# Patient Record
Sex: Male | Born: 2000 | Race: Black or African American | Hispanic: No | Marital: Single | Smoking: Never smoker
Health system: Southern US, Community
[De-identification: ages and names within clinical notes are randomized; demographics above are authoritative.]

## PROBLEM LIST (undated history)

## (undated) DIAGNOSIS — M25312 Other instability, left shoulder: Secondary | ICD-10-CM

## (undated) DIAGNOSIS — Z9229 Personal history of other drug therapy: Secondary | ICD-10-CM

## (undated) HISTORY — PX: OTHER SURGICAL HISTORY: SHX169

---
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
## 2019-03-25 DIAGNOSIS — M2630 Unspecified anomaly of tooth position of fully erupted tooth or teeth: Secondary | ICD-10-CM | POA: Diagnosis not present

## 2019-04-15 DIAGNOSIS — Z20828 Contact with and (suspected) exposure to other viral communicable diseases: Secondary | ICD-10-CM | POA: Diagnosis not present

## 2020-05-18 ENCOUNTER — Telehealth: Payer: Self-pay | Admitting: Family Medicine

## 2020-05-18 ENCOUNTER — Encounter: Payer: Self-pay | Admitting: Family Medicine

## 2020-05-18 NOTE — Telephone Encounter (Signed)
Unable to update his phone number. Mom will have him call us back.

## 2021-07-11 DIAGNOSIS — J351 Hypertrophy of tonsils: Secondary | ICD-10-CM | POA: Diagnosis not present

## 2021-07-11 DIAGNOSIS — L659 Nonscarring hair loss, unspecified: Secondary | ICD-10-CM | POA: Diagnosis not present

## 2022-05-07 DIAGNOSIS — R0789 Other chest pain: Secondary | ICD-10-CM | POA: Diagnosis not present

## 2023-01-05 ENCOUNTER — Emergency Department (HOSPITAL_BASED_OUTPATIENT_CLINIC_OR_DEPARTMENT_OTHER): Payer: Managed Care, Other (non HMO)

## 2023-01-05 ENCOUNTER — Other Ambulatory Visit: Payer: Self-pay

## 2023-01-05 ENCOUNTER — Encounter (HOSPITAL_BASED_OUTPATIENT_CLINIC_OR_DEPARTMENT_OTHER): Payer: Self-pay | Admitting: Emergency Medicine

## 2023-01-05 DIAGNOSIS — S99922A Unspecified injury of left foot, initial encounter: Secondary | ICD-10-CM | POA: Diagnosis present

## 2023-01-05 DIAGNOSIS — S91332A Puncture wound without foreign body, left foot, initial encounter: Secondary | ICD-10-CM | POA: Diagnosis not present

## 2023-01-05 DIAGNOSIS — Z0389 Encounter for observation for other suspected diseases and conditions ruled out: Secondary | ICD-10-CM | POA: Diagnosis not present

## 2023-01-05 DIAGNOSIS — Z23 Encounter for immunization: Secondary | ICD-10-CM | POA: Insufficient documentation

## 2023-01-05 DIAGNOSIS — Y990 Civilian activity done for income or pay: Secondary | ICD-10-CM | POA: Diagnosis not present

## 2023-01-05 DIAGNOSIS — W450XXA Nail entering through skin, initial encounter: Secondary | ICD-10-CM | POA: Insufficient documentation

## 2023-01-05 NOTE — ED Triage Notes (Signed)
Pt c/o pain to LT heel s/p stepping on a nail that went through his boot into heel last night; dark spot noted to heel

## 2023-01-06 ENCOUNTER — Emergency Department (HOSPITAL_BASED_OUTPATIENT_CLINIC_OR_DEPARTMENT_OTHER)
Admission: EM | Admit: 2023-01-06 | Discharge: 2023-01-06 | Disposition: A | Discharge status: Home / Self Care | Payer: Managed Care, Other (non HMO) | Attending: Emergency Medicine | Admitting: Emergency Medicine

## 2023-01-06 DIAGNOSIS — T148XXA Other injury of unspecified body region, initial encounter: Secondary | ICD-10-CM

## 2023-01-06 MED ORDER — IBUPROFEN 400 MG PO TABS
600.0000 mg | ORAL_TABLET | Freq: Once | ORAL | Status: AC
  Administered 2023-01-06: 600 mg via ORAL
  Filled 2023-01-06: qty 1

## 2023-01-06 MED ORDER — LEVOFLOXACIN 500 MG PO TABS: 500.0000 mg | ORAL_TABLET | Freq: Every day | ORAL | 0 refills | Status: AC

## 2023-01-06 MED ORDER — TETANUS-DIPHTH-ACELL PERTUSSIS 5-2.5-18.5 LF-MCG/0.5 IM SUSY
0.5000 mL | PREFILLED_SYRINGE | Freq: Once | INTRAMUSCULAR | Status: AC
  Administered 2023-01-06: 0.5 mL via INTRAMUSCULAR
  Filled 2023-01-06: qty 0.5

## 2023-01-06 MED ORDER — BACITRACIN ZINC 500 UNIT/GM EX OINT
TOPICAL_OINTMENT | Freq: Two times a day (BID) | CUTANEOUS | Status: DC
  Administered 2023-01-06: 31.5 via TOPICAL
  Filled 2023-01-06: qty 28.35

## 2023-01-06 NOTE — ED Provider Notes (Signed)
[facility] Provider Note   CSN: [id number] Arrival date & time: 01/05/23  2115     History  Chief Complaint  Patient presents with   Puncture Wound    Keith Dickson is a 22 y.o. male.  The history is provided by the patient.  Keith Dickson is a 22 y.o. male who presents to the Emergency Department complaining of foot pain.  He stepped on a nail with his left foot on Saturday night.  This went through his tennis shoes.  The nail was from a pallet when he was at work.  Tetanus is unknown.  He can bear weight but does have some discomfort to this area.      Home Medications Prior to Admission medications   Medication Sig Start Date End Date Taking? Authorizing Provider  levofloxacin (LEVAQUIN) 500 MG tablet Take 1 tablet (500 mg total) by mouth daily. 01/06/23  Yes Tilden Fossa, MD      Allergies    Patient has no known allergies.    Review of Systems   Review of Systems  All other systems reviewed and are negative.   Physical Exam Updated Vital Signs BP 124/74   Pulse 70   Temp 98 F (36.7 C) (Oral)   Resp 18   Ht 6' 0.25" (1.835 m)   Wt 98.4 kg   SpO2 99%   BMI 29.23 kg/m  Physical Exam Vitals and nursing note reviewed.  Constitutional:      Appearance: He is well-developed.  HENT:     Head: Normocephalic and atraumatic.  Cardiovascular:     Rate and Rhythm: Normal rate and regular rhythm.  Pulmonary:     Effort: Pulmonary effort is normal. No respiratory distress.  Musculoskeletal:        General: No tenderness.     Comments: Mild Soft tissue swelling and tenderness over plantar surface of left heel.  There is less than 0.5cm central wound.    Skin:    General: Skin is warm and dry.  Neurological:     Mental Status: He is alert and oriented to person, place, and time.  Psychiatric:        Behavior: Behavior normal.     ED Results / Procedures / Treatments   Labs (all labs ordered are  listed, but only abnormal results are displayed) Labs Reviewed - No data to display  EKG None  Radiology DG Foot Complete Left  Result Date: 01/05/2023 CLINICAL DATA:  r/o fb Puncture wound to left heel, stepped down on a nail in a board at work. EXAM: LEFT FOOT - COMPLETE 3+ VIEW COMPARISON:  None Available. FINDINGS: There is no evidence of fracture or dislocation. There is no evidence of arthropathy or other focal bone abnormality. Soft tissues are unremarkable. No retained radiopaque foreign body. IMPRESSION: Negative. Electronically Signed   By: Tish Frederickson M.D.   On: 01/05/2023 22:16    Procedures Procedures    Medications Ordered in ED Medications  bacitracin ointment (31.5 Applications Topical Given 01/06/23 0047)  Tdap (BOOSTRIX) injection 0.5 mL (0.5 mLs Intramuscular Given 01/06/23 0047)  ibuprofen (ADVIL) tablet 600 mg (600 mg Oral Given 01/06/23 0051)    ED Course/ Medical Decision Making/ A&P                             Medical Decision Making Amount and/or Complexity of Data Reviewed Radiology: ordered.  Risk  OTC drugs. Prescription drug management.   Patient here for evaluation of puncture wound to the left heel secondary to nail that penetrated his tennis shoe.  He does have some mild tenderness and local erythema.  No active bleeding.  Plain films are negative for foreign body.  Discussed with patient home care for puncture wound.  Discussed outpatient follow-up as well as return precautions.  Will prescribe antibiotics.  Discussed with patient if he has any erythema in the next 24 hours that he should start the antibiotics.  Tetanus was updated.        Final Clinical Impression(s) / ED Diagnoses Final diagnoses:  Puncture wound    Rx / DC Orders ED Discharge Orders          Ordered    levofloxacin (LEVAQUIN) 500 MG tablet  Daily        01/06/23 0044              Tilden Fossa, MD 01/06/23 [phone]
